# Patient Record
Sex: Male | Born: 1965 | Race: White | Hispanic: No | Marital: Single | Smoking: Current every day smoker
Health system: Southern US, Community
[De-identification: ages and names within clinical notes are randomized; demographics above are authoritative.]

## PROBLEM LIST (undated history)

## (undated) DIAGNOSIS — F909 Attention-deficit hyperactivity disorder, unspecified type: Secondary | ICD-10-CM

---

## 2001-11-28 ENCOUNTER — Emergency Department (HOSPITAL_COMMUNITY): Admission: EM | Admit: 2001-11-28 | Discharge: 2001-11-28 | Payer: Self-pay | Admitting: Emergency Medicine

## 2003-07-29 ENCOUNTER — Emergency Department (HOSPITAL_COMMUNITY): Admission: EM | Admit: 2003-07-29 | Discharge: 2003-07-29 | Payer: Self-pay | Admitting: Emergency Medicine

## 2004-03-14 ENCOUNTER — Emergency Department: Payer: Self-pay | Admitting: Emergency Medicine

## 2005-05-28 ENCOUNTER — Emergency Department: Payer: Self-pay | Admitting: Emergency Medicine

## 2006-01-15 ENCOUNTER — Emergency Department: Payer: Self-pay | Admitting: Emergency Medicine

## 2006-02-17 ENCOUNTER — Emergency Department: Payer: Self-pay | Admitting: Unknown Physician Specialty

## 2006-05-22 ENCOUNTER — Emergency Department: Payer: Self-pay | Admitting: Emergency Medicine

## 2006-07-19 ENCOUNTER — Emergency Department (HOSPITAL_COMMUNITY): Admission: EM | Admit: 2006-07-19 | Discharge: 2006-07-19 | Payer: Self-pay | Admitting: Emergency Medicine

## 2006-08-29 ENCOUNTER — Emergency Department: Payer: Self-pay | Admitting: Emergency Medicine

## 2006-09-08 ENCOUNTER — Other Ambulatory Visit: Payer: Self-pay

## 2006-09-08 ENCOUNTER — Observation Stay: Payer: Self-pay | Admitting: Internal Medicine

## 2006-09-10 ENCOUNTER — Emergency Department: Payer: Self-pay | Admitting: Internal Medicine

## 2006-11-19 ENCOUNTER — Emergency Department: Payer: Self-pay | Admitting: Unknown Physician Specialty

## 2006-11-28 ENCOUNTER — Emergency Department: Payer: Self-pay | Admitting: Emergency Medicine

## 2006-12-18 ENCOUNTER — Emergency Department (HOSPITAL_COMMUNITY): Admission: EM | Admit: 2006-12-18 | Discharge: 2006-12-18 | Payer: Self-pay | Admitting: Emergency Medicine

## 2007-04-03 ENCOUNTER — Emergency Department: Payer: Self-pay | Admitting: Internal Medicine

## 2007-05-15 ENCOUNTER — Emergency Department: Payer: Self-pay | Admitting: Emergency Medicine

## 2007-08-13 ENCOUNTER — Emergency Department: Payer: Self-pay | Admitting: Emergency Medicine

## 2007-10-01 ENCOUNTER — Emergency Department: Payer: Self-pay | Admitting: Emergency Medicine

## 2009-10-17 IMAGING — CT CT HEAD WITHOUT CONTRAST
2 series · 16 of 30 positions shown, 20 images · non-contrast
Comparison: none

REASON FOR EXAM: Headache
COMMENTS:

[Series 2: without · axial · non-contrast · 0.46mm/px · z∈[+1230,+1360]mm · 13 of 32 slices shown, 17 images]
[im 3/32  brain]
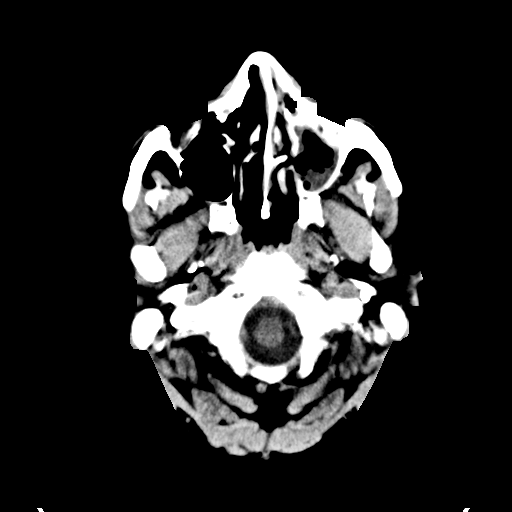
[im 3/32  bone]
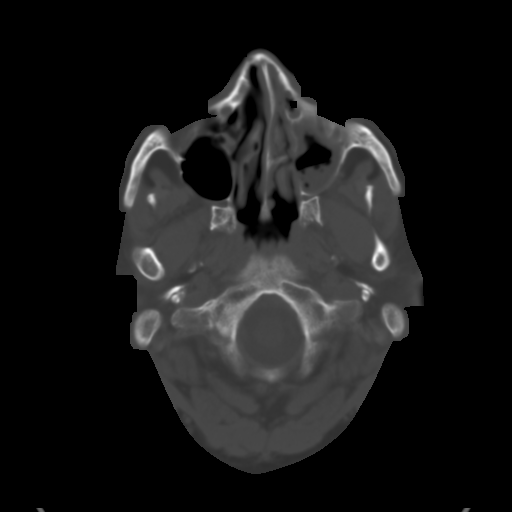
[im 5/32  brain]
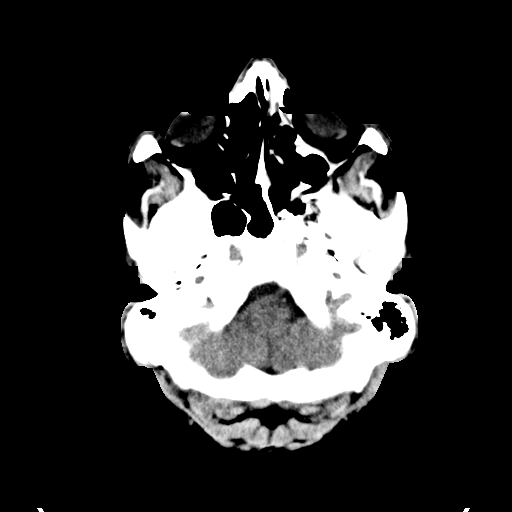
[im 7/32  brain]
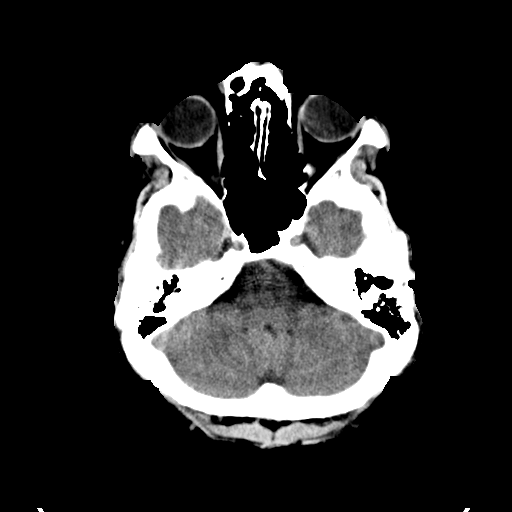
[im 9/32  brain]
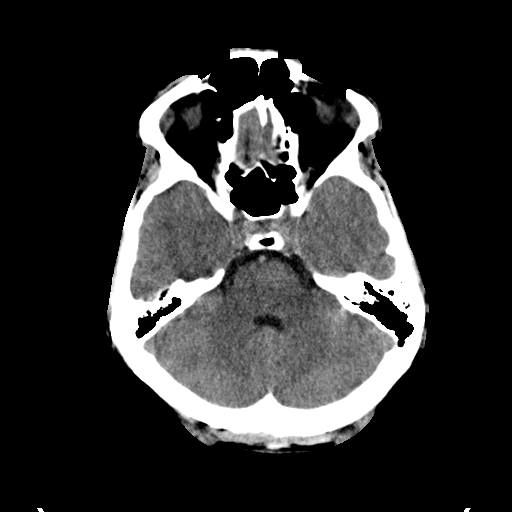
[im 12/32  brain]
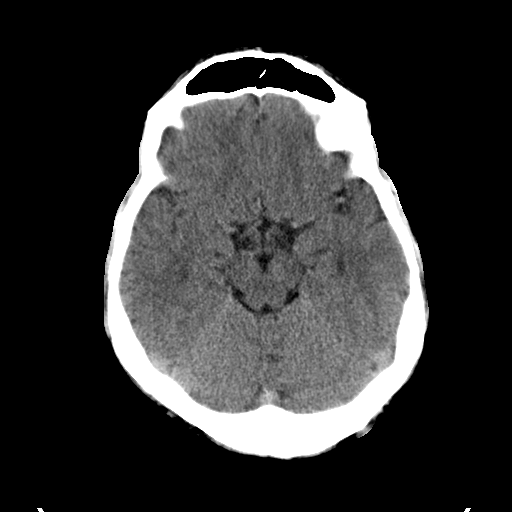
[im 12/32  bone]
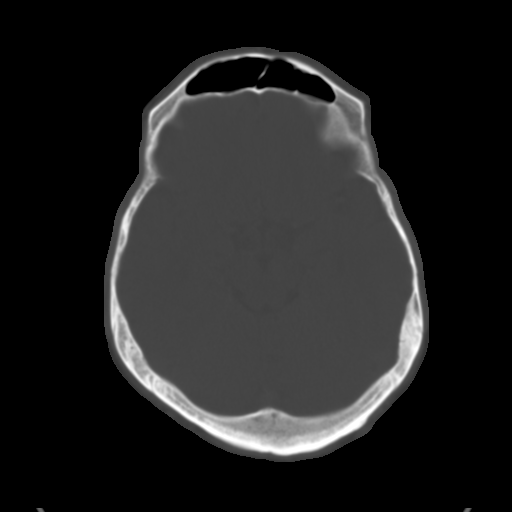
[im 14/32  brain]
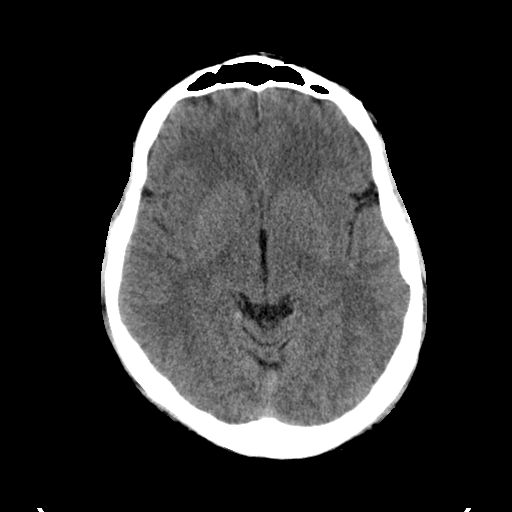
[im 16/32  brain]
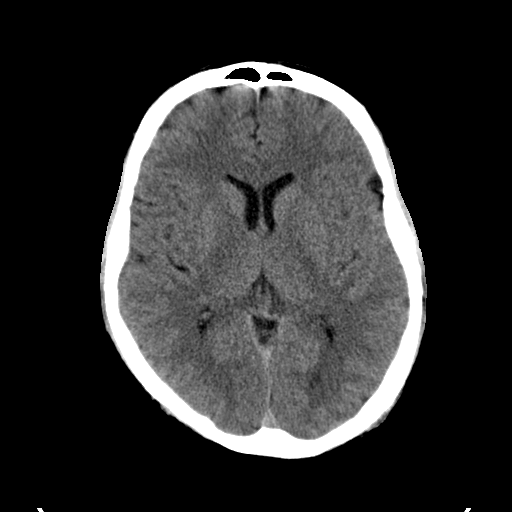
[im 18/32  brain]
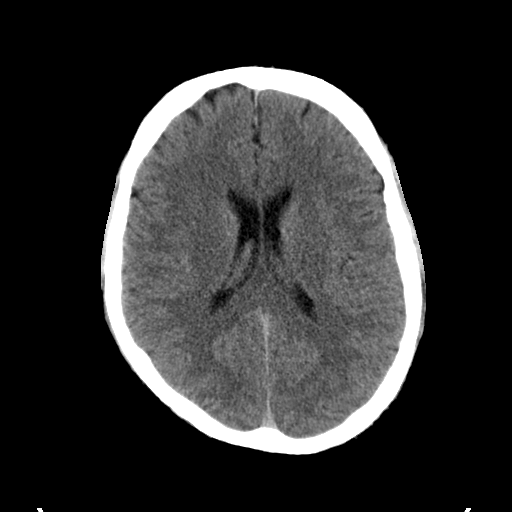
[im 20/32  brain]
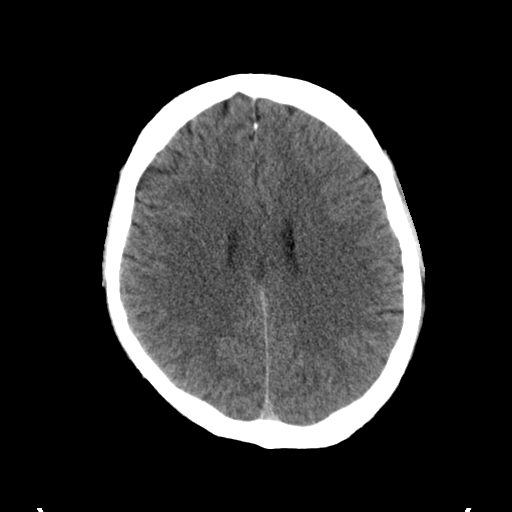
[im 20/32  bone]
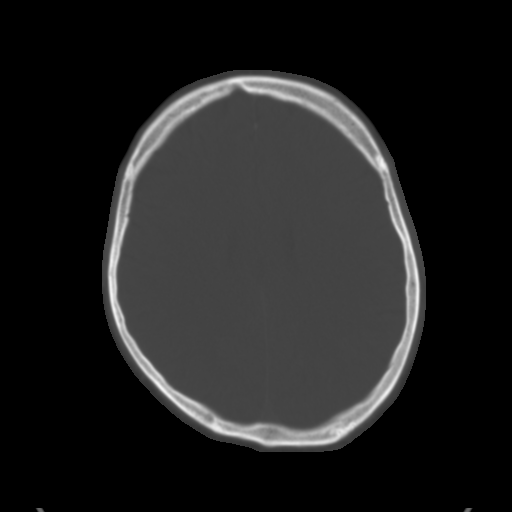
[im 23/32  brain]
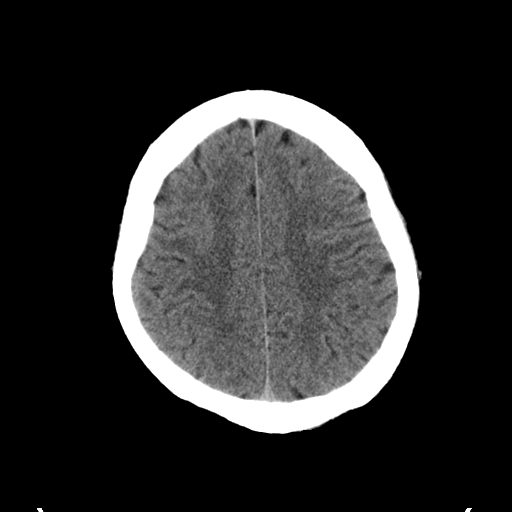
[im 25/32  brain]
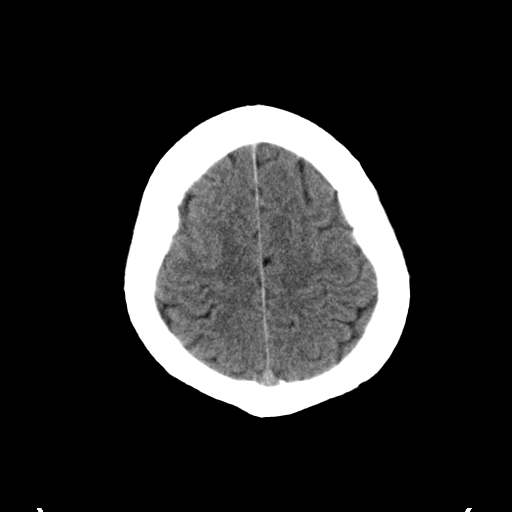
[im 27/32  brain]
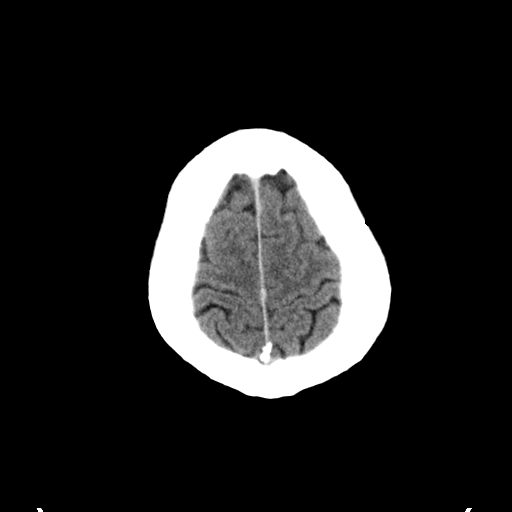
[im 29/32  brain]
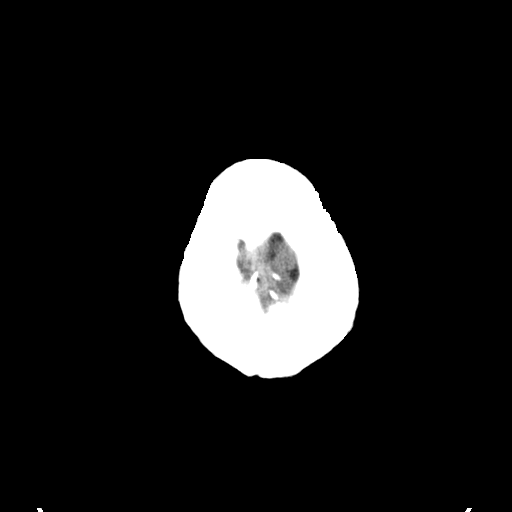
[im 29/32  bone]
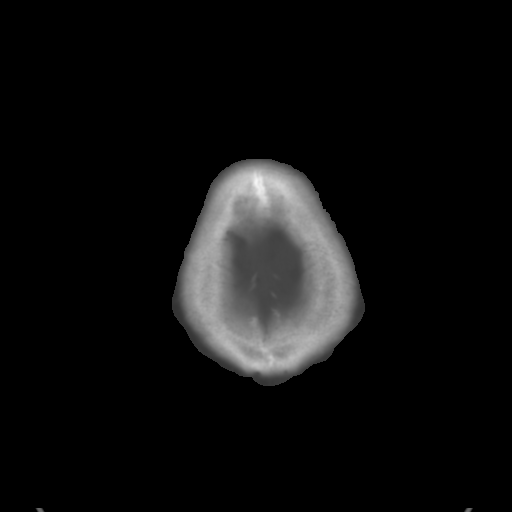

[Series 3: bone · axial · 0.46mm/px · z∈[+1230,+1275]mm · 3 of 32 slices shown]
[im 3/32  bone]
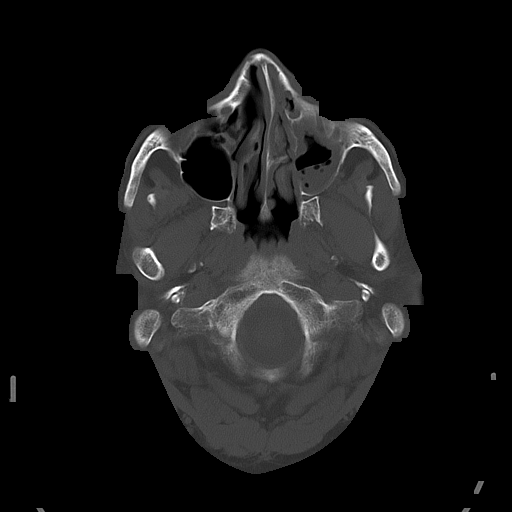
[im 7/32  bone]
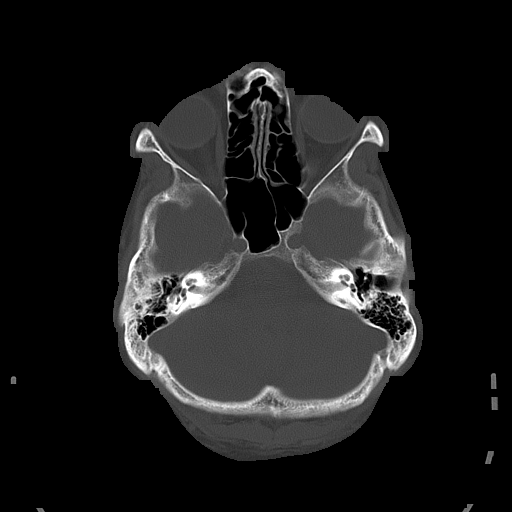
[im 12/32  bone]
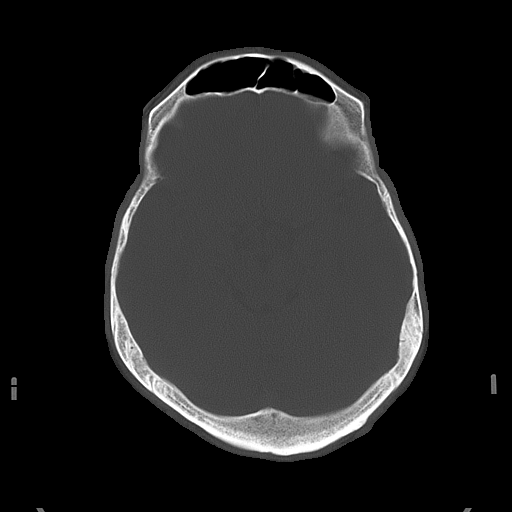

[16 of 30 positions shown; findings below may reference images not displayed]

PROCEDURE:     CT  - CT HEAD WITHOUT CONTRAST  - April 03, 2007  [DATE]

RESULT:     There is no evidence of intra-axial or extra-axial fluid
collections or evidence of acute hemorrhage. No secondary signs are
appreciated to suggest mass effect or subacute or chronic infarction. The
visualized bony skeleton demonstrates no evidence of fracture or
dislocation. There is near complete opacification of the LEFT maxillary
sinus.
IMPRESSION: 1.     No evidence of acute intracranial abnormalities.
2.     Findings appear to reflect the sequelae of sinus disease within the
LEFT maxillary sinus.
3.     Dr. Odd-Martin of the Emergency Department was informed of these findings
via preliminary faxed report on 04/03/2007 at [DATE], Central Standard Time.

## 2010-07-29 ENCOUNTER — Emergency Department (HOSPITAL_COMMUNITY)
Admission: EM | Admit: 2010-07-29 | Discharge: 2010-07-29 | Disposition: A | Discharge status: Home / Self Care | Payer: Self-pay | Attending: Emergency Medicine | Admitting: Emergency Medicine

## 2010-07-29 ENCOUNTER — Emergency Department (HOSPITAL_COMMUNITY): Payer: Self-pay

## 2010-07-29 DIAGNOSIS — R1110 Vomiting, unspecified: Secondary | ICD-10-CM | POA: Insufficient documentation

## 2010-07-29 DIAGNOSIS — M545 Low back pain, unspecified: Secondary | ICD-10-CM | POA: Insufficient documentation

## 2010-07-29 DIAGNOSIS — R109 Unspecified abdominal pain: Secondary | ICD-10-CM | POA: Insufficient documentation

## 2010-07-29 LAB — URINALYSIS, ROUTINE W REFLEX MICROSCOPIC
Glucose, UA: NEGATIVE mg/dL
Ketones, ur: NEGATIVE mg/dL
Specific Gravity, Urine: 1.028 (ref 1.005–1.030)
Urobilinogen, UA: 1 mg/dL (ref 0.0–1.0)

## 2010-07-29 LAB — BASIC METABOLIC PANEL
BUN: 16 mg/dL (ref 6–23)
Calcium: 9 mg/dL (ref 8.4–10.5)
Creatinine, Ser: 0.99 mg/dL (ref 0.50–1.35)
GFR calc non Af Amer: >60 mL/min (ref >60)
Glucose, Bld: 69 mg/dL — ABNORMAL LOW (ref 70–99)
Potassium: 3.8 mEq/L (ref 3.5–5.1)

## 2010-07-29 LAB — CBC
MCHC: 34.5 g/dL (ref 30.0–36.0)
Platelets: 145 10*3/uL — ABNORMAL LOW (ref 150–400)
RBC: 4.78 MIL/uL (ref 4.22–5.81)
WBC: 9 10*3/uL (ref 4.0–10.5)

## 2010-07-29 LAB — DIFFERENTIAL
Basophils Absolute: 0 10*3/uL (ref 0.0–0.1)
Eosinophils Absolute: 0.3 10*3/uL (ref 0.0–0.7)
Lymphocytes Relative: 45 % (ref 12–46)
Monocytes Absolute: 0.5 10*3/uL (ref 0.1–1.0)
Neutro Abs: 4.2 10*3/uL (ref 1.7–7.7)
Neutrophils Relative %: 47 % (ref 43–77)

## 2010-07-29 LAB — URINE MICROSCOPIC-ADD ON

## 2010-11-12 LAB — URINALYSIS, ROUTINE W REFLEX MICROSCOPIC
Bilirubin Urine: NEGATIVE
Glucose, UA: NEGATIVE
Ketones, ur: NEGATIVE
Nitrite: NEGATIVE
Protein, ur: NEGATIVE
Specific Gravity, Urine: 1.026
Urobilinogen, UA: 1
pH: 5.5

## 2010-11-12 LAB — DIFFERENTIAL
Eosinophils Absolute: 0.6
Eosinophils Relative: 7 — ABNORMAL HIGH
Monocytes Relative: 5

## 2010-11-12 LAB — BASIC METABOLIC PANEL
BUN: 9
CO2: 28
Chloride: 102
GFR calc non Af Amer: >60
Potassium: 3.9

## 2010-11-12 LAB — I-STAT 8, (EC8 V) (CONVERTED LAB)
Acid-Base Excess: 4 — ABNORMAL HIGH
BUN: 10
Bicarbonate: 28.7 — ABNORMAL HIGH
Chloride: 105
Glucose, Bld: 99
HCT: 43
Hemoglobin: 14.6
Operator id: 270651
Potassium: 4.1
Sodium: 137
TCO2: 30
pCO2, Ven: 44.7 — ABNORMAL LOW
pH, Ven: 7.417 — ABNORMAL HIGH

## 2010-11-12 LAB — CBC
MCHC: 34.3
Platelets: 248
WBC: 8.8

## 2010-11-12 LAB — POCT I-STAT CREATININE
Creatinine, Ser: 1.1
Operator id: 270651

## 2012-12-08 ENCOUNTER — Emergency Department: Payer: Self-pay | Admitting: Emergency Medicine

## 2012-12-08 LAB — DRUG SCREEN, URINE
Amphetamines, Ur Screen: NEGATIVE (ref ?–1000)
Benzodiazepine, Ur Scrn: POSITIVE (ref ?–200)
MDMA (Ecstasy)Ur Screen: NEGATIVE (ref ?–500)
Methadone, Ur Screen: POSITIVE (ref ?–300)
Opiate, Ur Screen: NEGATIVE (ref ?–300)
Tricyclic, Ur Screen: POSITIVE (ref ?–1000)

## 2012-12-08 LAB — COMPREHENSIVE METABOLIC PANEL
Albumin: 3.8 g/dL (ref 3.4–5.0)
Alkaline Phosphatase: 80 U/L (ref 50–136)
Chloride: 104 mmol/L (ref 98–107)
EGFR (Non-African Amer.): >60
Osmolality: 272 (ref 275–301)
Potassium: 4.2 mmol/L (ref 3.5–5.1)
SGOT(AST): 28 U/L (ref 15–37)
SGPT (ALT): 31 U/L (ref 12–78)

## 2012-12-08 LAB — CBC
HGB: 14.4 g/dL (ref 13.0–18.0)
MCH: 29.4 pg (ref 26.0–34.0)
MCHC: 35.1 g/dL (ref 32.0–36.0)
MCV: 84 fL (ref 80–100)
Platelet: 155 10*3/uL (ref 150–440)
RBC: 4.91 10*6/uL (ref 4.40–5.90)
WBC: 7.7 10*3/uL (ref 3.8–10.6)

## 2012-12-08 LAB — URINALYSIS, COMPLETE
Bilirubin,UR: NEGATIVE
Ph: 5 (ref 4.5–8.0)
Specific Gravity: 1.006 (ref 1.003–1.030)
WBC UR: 1 /HPF (ref 0–5)

## 2012-12-08 LAB — SALICYLATE LEVEL: Salicylates, Serum: 2.3 mg/dL

## 2012-12-08 LAB — ETHANOL: Ethanol %: <0.003 % (ref 0.000–0.080)

## 2015-09-19 ENCOUNTER — Emergency Department: Payer: Self-pay

## 2015-09-19 ENCOUNTER — Emergency Department
Admission: EM | Admit: 2015-09-19 | Discharge: 2015-09-20 | Disposition: A | Discharge status: Other Acute Inpatient Hospital | Payer: Self-pay | Attending: Emergency Medicine | Admitting: Emergency Medicine

## 2015-09-19 DIAGNOSIS — T1491XA Suicide attempt, initial encounter: Secondary | ICD-10-CM

## 2015-09-19 DIAGNOSIS — X781XXA Intentional self-harm by knife, initial encounter: Secondary | ICD-10-CM | POA: Insufficient documentation

## 2015-09-19 DIAGNOSIS — F909 Attention-deficit hyperactivity disorder, unspecified type: Secondary | ICD-10-CM | POA: Insufficient documentation

## 2015-09-19 DIAGNOSIS — S31109A Unspecified open wound of abdominal wall, unspecified quadrant without penetration into peritoneal cavity, initial encounter: Secondary | ICD-10-CM

## 2015-09-19 DIAGNOSIS — F17210 Nicotine dependence, cigarettes, uncomplicated: Secondary | ICD-10-CM | POA: Insufficient documentation

## 2015-09-19 DIAGNOSIS — S31119A Laceration without foreign body of abdominal wall, unspecified quadrant without penetration into peritoneal cavity, initial encounter: Secondary | ICD-10-CM | POA: Insufficient documentation

## 2015-09-19 DIAGNOSIS — T1491 Suicide attempt: Secondary | ICD-10-CM | POA: Insufficient documentation

## 2015-09-19 DIAGNOSIS — Y999 Unspecified external cause status: Secondary | ICD-10-CM | POA: Insufficient documentation

## 2015-09-19 DIAGNOSIS — Y9289 Other specified places as the place of occurrence of the external cause: Secondary | ICD-10-CM | POA: Insufficient documentation

## 2015-09-19 DIAGNOSIS — Y9389 Activity, other specified: Secondary | ICD-10-CM | POA: Insufficient documentation

## 2015-09-19 HISTORY — DX: Attention-deficit hyperactivity disorder, unspecified type: F90.9

## 2015-09-19 LAB — COMPREHENSIVE METABOLIC PANEL
ALBUMIN: 4.1 g/dL (ref 3.5–5.0)
ALT: 25 U/L (ref 17–63)
AST: 30 U/L (ref 15–41)
Alkaline Phosphatase: 77 U/L (ref 38–126)
Anion gap: 6 (ref 5–15)
BUN: 12 mg/dL (ref 6–20)
CO2: 26 mmol/L (ref 22–32)
CREATININE: 1.04 mg/dL (ref 0.61–1.24)
Calcium: 9 mg/dL (ref 8.9–10.3)
Chloride: 107 mmol/L (ref 101–111)
GFR calc Af Amer: >60 mL/min (ref >60)
GFR calc non Af Amer: >60 mL/min (ref >60)
GLUCOSE: 84 mg/dL (ref 65–99)
POTASSIUM: 4 mmol/L (ref 3.5–5.1)
Sodium: 139 mmol/L (ref 135–145)
TOTAL PROTEIN: 7.1 g/dL (ref 6.5–8.1)

## 2015-09-19 LAB — CBC WITH DIFFERENTIAL/PLATELET
BASOS ABS: 0 10*3/uL (ref 0–0.1)
Basophils Relative: 0 %
EOS PCT: 3 %
Eosinophils Absolute: 0.2 10*3/uL (ref 0–0.7)
HEMATOCRIT: 42.6 % (ref 40.0–52.0)
HEMOGLOBIN: 14.5 g/dL (ref 13.0–18.0)
LYMPHS ABS: 2.3 10*3/uL (ref 1.0–3.6)
LYMPHS PCT: 38 %
MCH: 28.6 pg (ref 26.0–34.0)
MCHC: 34.1 g/dL (ref 32.0–36.0)
MCV: 83.8 fL (ref 80.0–100.0)
Monocytes Absolute: 0.3 10*3/uL (ref 0.2–1.0)
Monocytes Relative: 5 %
NEUTROS ABS: 3.3 10*3/uL (ref 1.4–6.5)
NEUTROS PCT: 54 %
PLATELETS: 155 10*3/uL (ref 150–440)
RBC: 5.08 MIL/uL (ref 4.40–5.90)
RDW: 14.3 % (ref 11.5–14.5)
WBC: 6.1 10*3/uL (ref 3.8–10.6)

## 2015-09-19 LAB — ETHANOL

## 2015-09-19 LAB — ACETAMINOPHEN LEVEL: Acetaminophen (Tylenol), Serum: <10 ug/mL — ABNORMAL LOW (ref 10–30)

## 2015-09-19 LAB — SALICYLATE LEVEL: Salicylate Lvl: <4 mg/dL (ref 2.8–30.0)

## 2015-09-19 MED ORDER — LORAZEPAM 2 MG/ML IJ SOLN
INTRAMUSCULAR | Status: AC
Start: 2015-09-19 — End: 2015-09-19
  Administered 2015-09-19: 2 mg via INTRAVENOUS
  Filled 2015-09-19: qty 1

## 2015-09-19 MED ORDER — DIPHENHYDRAMINE HCL 50 MG/ML IJ SOLN
INTRAMUSCULAR | Status: AC
  Filled 2015-09-19: qty 1

## 2015-09-19 MED ORDER — LORAZEPAM 2 MG/ML IJ SOLN: 2.0000 mg | Freq: Once | INTRAMUSCULAR | Status: DC

## 2015-09-19 MED ORDER — HALOPERIDOL LACTATE 5 MG/ML IJ SOLN
2.5000 mg | Freq: Once | INTRAMUSCULAR | Status: AC
  Administered 2015-09-19: 2.5 mg via INTRAMUSCULAR

## 2015-09-19 MED ORDER — HALOPERIDOL LACTATE 5 MG/ML IJ SOLN
INTRAMUSCULAR | Status: AC
Start: 2015-09-19 — End: 2015-09-19
  Administered 2015-09-19: 2.5 mg via INTRAMUSCULAR
  Filled 2015-09-19: qty 1

## 2015-09-19 MED ORDER — LORAZEPAM 2 MG/ML IJ SOLN
2.0000 mg | Freq: Once | INTRAMUSCULAR | Status: AC
  Administered 2015-09-19 (×2): 2 mg via INTRAVENOUS

## 2015-09-19 MED ORDER — IOPAMIDOL (ISOVUE-300) INJECTION 61%
100.0000 mL | Freq: Once | INTRAVENOUS | Status: AC | PRN
  Administered 2015-09-19: 100 mL via INTRAVENOUS

## 2015-09-19 MED ORDER — HALOPERIDOL LACTATE 5 MG/ML IJ SOLN
INTRAMUSCULAR | Status: AC
  Filled 2015-09-19: qty 1

## 2015-09-19 MED ORDER — LORAZEPAM 2 MG/ML IJ SOLN
INTRAMUSCULAR | Status: AC
  Administered 2015-09-19: 2 mg via INTRAVENOUS
  Filled 2015-09-19: qty 1

## 2015-09-19 NOTE — [facility] Counselor (Signed)
TTS Counselor made attempt to complete assessment; pt continues to be too drowsy participate in interview.

## 2015-09-19 NOTE — ED Notes (Signed)
Pt returned from CT °

## 2015-09-19 NOTE — ED Notes (Signed)
Pt given 2 mg ativan per MD Malinda verbal order

## 2015-09-19 NOTE — ED Notes (Signed)
Pt withering around bed c/o stomach pain

## 2015-09-19 NOTE — ED Notes (Signed)
Security at bedside
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
## 2015-09-19 NOTE — ED Notes (Signed)
Pt transported to CT via stretcher with ODS [place]o.

## 2015-09-19 NOTE — ED Notes (Signed)
Lab called about pts lab results, stated that they were being run

## 2015-09-19 NOTE — ED Notes (Signed)
2.5 mg haldol and 2 mg ativan given per MD Malinda verbal order

## 2015-09-19 NOTE — ED Notes (Addendum)
Lab called about pts CMP results
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
## 2015-09-19 NOTE — ED Notes (Signed)
Pt refusing blood draw at this time. MD Darnelle CatalanMalinda notified

## 2015-09-19 NOTE — ED Notes (Signed)
Spoke w/ lab about pts results, they stated that pts lab had to be rerun under different name
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
## 2015-09-19 NOTE — ED Notes (Signed)
Spoke w/ pts sister, she stated that pts does have drug problem and requested that he be psychiatrically evaluated   Ludwig ClarksAngela Jefferies [phone]

## 2015-09-19 NOTE — ED Notes (Signed)
Patient transported to CT 

## 2015-09-19 NOTE — ED Notes (Signed)
Pt has agreed to cooperate at this time.

## 2015-09-20 ENCOUNTER — Inpatient Hospital Stay
Admission: RE | Admit: 2015-09-20 | Discharge: 2015-09-25 | DRG: 882 | Disposition: A | Discharge status: Home / Self Care | Payer: No Typology Code available for payment source | Source: Intra-hospital | Attending: Psychiatry | Admitting: Psychiatry

## 2015-09-20 DIAGNOSIS — F17210 Nicotine dependence, cigarettes, uncomplicated: Secondary | ICD-10-CM | POA: Diagnosis present

## 2015-09-20 DIAGNOSIS — F322 Major depressive disorder, single episode, severe without psychotic features: Secondary | ICD-10-CM

## 2015-09-20 DIAGNOSIS — S31119A Laceration without foreign body of abdominal wall, unspecified quadrant without penetration into peritoneal cavity, initial encounter: Secondary | ICD-10-CM | POA: Diagnosis present

## 2015-09-20 DIAGNOSIS — F17200 Nicotine dependence, unspecified, uncomplicated: Secondary | ICD-10-CM | POA: Diagnosis present

## 2015-09-20 DIAGNOSIS — Y92414 Local residential or business street as the place of occurrence of the external cause: Secondary | ICD-10-CM

## 2015-09-20 DIAGNOSIS — Y281XXA Contact with knife, undetermined intent, initial encounter: Secondary | ICD-10-CM | POA: Diagnosis present

## 2015-09-20 DIAGNOSIS — F1120 Opioid dependence, uncomplicated: Secondary | ICD-10-CM

## 2015-09-20 DIAGNOSIS — Z811 Family history of alcohol abuse and dependence: Secondary | ICD-10-CM | POA: Diagnosis not present

## 2015-09-20 DIAGNOSIS — F4324 Adjustment disorder with disturbance of conduct: Secondary | ICD-10-CM

## 2015-09-20 DIAGNOSIS — F1021 Alcohol dependence, in remission: Secondary | ICD-10-CM

## 2015-09-20 MED ORDER — HYDROXYZINE HCL 25 MG PO TABS
25.0000 mg | ORAL_TABLET | Freq: Three times a day (TID) | ORAL | Status: DC | PRN
  Administered 2015-09-21 – 2015-09-25 (×6): 25 mg via ORAL
  Filled 2015-09-20 (×5): qty 1

## 2015-09-20 MED ORDER — TRAZODONE HCL 100 MG PO TABS
100.0000 mg | ORAL_TABLET | Freq: Every evening | ORAL | Status: DC | PRN
  Administered 2015-09-21 – 2015-09-24 (×4): 100 mg via ORAL
  Filled 2015-09-20 (×4): qty 1

## 2015-09-20 MED ORDER — LIDOCAINE-EPINEPHRINE 1 %-1:100000 IJ SOLN
30.0000 mL | Freq: Once | INTRAMUSCULAR | Status: AC
Start: 2015-09-20 — End: 2015-09-20
  Administered 2015-09-20: 30 mL via INTRADERMAL

## 2015-09-20 MED ORDER — ACETAMINOPHEN 325 MG PO TABS
650.0000 mg | ORAL_TABLET | Freq: Four times a day (QID) | ORAL | Status: DC | PRN
  Administered 2015-09-21: 650 mg via ORAL
  Filled 2015-09-20: qty 2

## 2015-09-20 MED ORDER — ALUM & MAG HYDROXIDE-SIMETH 200-200-20 MG/5ML PO SUSP: 30.0000 mL | ORAL | Status: DC | PRN

## 2015-09-20 MED ORDER — LIDOCAINE-EPINEPHRINE (PF) 1 %-1:200000 IJ SOLN
INTRAMUSCULAR | Status: AC
  Filled 2015-09-20: qty 30

## 2015-09-20 MED ORDER — MAGNESIUM HYDROXIDE 400 MG/5ML PO SUSP: 30.0000 mL | Freq: Every day | ORAL | Status: DC | PRN
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
## 2015-09-20 NOTE — ED Notes (Signed)
Patient noted in room. No complaints, stable, in no acute distress. Q15 minute rounds and monitoring via Security Cameras to continue.  

## 2015-09-20 NOTE — ED Notes (Signed)
BEHAVIORAL HEALTH ROUNDING Patient sleeping: Yes.   Patient alert and oriented: not applicable SLEEPING Behavior appropriate: Yes.  ; If no, describe: SLEEPING Nutrition and fluids offered: No SLEEPING Toileting and hygiene offered: NoSLEEPING Sitter present: not applicable, Q 15 min safety rounds and observation. Law enforcement present: Yes ODS 
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
## 2015-09-20 NOTE — ED Notes (Signed)
Patient noted in sleeping room. No complaints, stable, in no acute distress. Q15 minute rounds and monitoring via Security Cameras to continue. 

## 2015-09-20 NOTE — ED Notes (Signed)
Dr. Darnelle CatalanMalinda at bedside with staple gun

## 2015-09-20 NOTE — ED Notes (Addendum)
Pt slept for most of a.m. He is now speaking with Rozell SearingWendy RN.

## 2015-09-20 NOTE — ED Notes (Signed)
Pt is alert on admission, but is also irritable and somewhat hostile. Writer [facility] and offered fluids but pt refused water. Pt would not answer questions about SI/HI and AVH. 15 minute checks are ongoing for safety. Pt went to sleep soon after admission.

## 2015-09-20 NOTE — ED Notes (Signed)

## 2015-09-20 NOTE — ED Notes (Signed)
Patient noted in day room. No complaints, stable, in no acute distress. Q15 minute rounds and monitoring via Security Cameras to continue. 

## 2015-09-20 NOTE — ED Notes (Signed)
Report called to Eric, RN in ED [facility].  

## 2015-09-20 NOTE — ED Notes (Signed)
BEHAVIORAL HEALTH ROUNDING  Patient sleeping: No.  Patient alert and oriented: yes  Behavior appropriate: Yes. ; If no, describe:  Nutrition and fluids offered: Yes  Toileting and hygiene offered: Yes  Sitter present: not applicable, Q 15 min safety rounds and observation.  Law enforcement present: Yes ODS  

## 2015-09-20 NOTE — ED Notes (Signed)
Report to include Situation, Background, Assessment, and Recommendations received from Melissa NoonKaren Shugart RN. Patient alert and oriented, warm and dry, in no acute distress. Patient denies SI, HI, AVH and pain. Patient made aware of Q15 minute rounds and security cameras for their safety. Patient instructed to come to me with needs or concerns.
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
## 2015-09-20 NOTE — ED Notes (Signed)
Snack and beverage given. 

## 2015-09-20 NOTE — Progress Notes (Addendum)
[facility]. Toni Amendlapacs.  Attending Physician will be Dr. Jennet MaduroPucilowska.   [facility] Marylu LundJanet.   Intake Paper Work has been signed and placed on patient chart.  ER staff is aware of the admission Irving Burton( [facility].; ER MD; Amy Patient's Nurse & Lowanda FosterBrittany  Patient Access).    09/20/2015 Cheryl FlashNicole Hoke Baer, MS, NCC, LPCA Therapeutic Triage Specialist

## 2015-09-20 NOTE — Progress Notes (Signed)
Pt being reviewed for possible admission to [facility]. H&P and Assessment have been faxed to the B[facility] for the charge nurse to review and provide bed assignment.      Nicole Ebbie Cherry, MS, NCC, LPCA Therapeutic Triage Specialist    

## 2015-09-21 ENCOUNTER — Encounter: Payer: Self-pay | Admitting: Psychiatry

## 2015-09-21 DIAGNOSIS — F17200 Nicotine dependence, unspecified, uncomplicated: Secondary | ICD-10-CM | POA: Diagnosis present

## 2015-09-21 DIAGNOSIS — F4324 Adjustment disorder with disturbance of conduct: Principal | ICD-10-CM

## 2015-09-21 DIAGNOSIS — F1021 Alcohol dependence, in remission: Secondary | ICD-10-CM

## 2015-09-21 DIAGNOSIS — F1120 Opioid dependence, uncomplicated: Secondary | ICD-10-CM

## 2015-09-21 LAB — URINALYSIS COMPLETE WITH MICROSCOPIC (ARMC ONLY)
BILIRUBIN URINE: NEGATIVE
Glucose, UA: 150 mg/dL — AB
HGB URINE DIPSTICK: NEGATIVE
KETONES UR: NEGATIVE mg/dL
Leukocytes, UA: NEGATIVE
Nitrite: NEGATIVE
PH: 7 (ref 5.0–8.0)
PROTEIN: NEGATIVE mg/dL
Specific Gravity, Urine: 1.013 (ref 1.005–1.030)

## 2015-09-21 LAB — URINE DRUG SCREEN, QUALITATIVE (ARMC ONLY)
Amphetamines, Ur Screen: NOT DETECTED
BARBITURATES, UR SCREEN: NOT DETECTED
BENZODIAZEPINE, UR SCRN: POSITIVE — AB
Cannabinoid 50 Ng, Ur ~~LOC~~: NOT DETECTED
Cocaine Metabolite,Ur ~~LOC~~: NOT DETECTED
MDMA (Ecstasy)Ur Screen: NOT DETECTED
METHADONE SCREEN, URINE: POSITIVE — AB
OPIATE, UR SCREEN: NOT DETECTED
PHENCYCLIDINE (PCP) UR S: NOT DETECTED
Tricyclic, Ur Screen: NOT DETECTED

## 2015-09-21 MED ORDER — IBUPROFEN 600 MG PO TABS
600.0000 mg | ORAL_TABLET | Freq: Four times a day (QID) | ORAL | Status: DC | PRN
  Administered 2015-09-21 – 2015-09-25 (×7): 600 mg via ORAL
  Filled 2015-09-21 (×7): qty 1

## 2015-09-21 MED ORDER — NICOTINE 21 MG/24HR TD PT24
21.0000 mg | MEDICATED_PATCH | Freq: Every day | TRANSDERMAL | Status: DC
  Administered 2015-09-22 – 2015-09-25 (×4): 21 mg via TRANSDERMAL
  Filled 2015-09-21 (×5): qty 1

## 2015-09-21 MED ORDER — ACETAMINOPHEN 500 MG PO TABS
1000.0000 mg | ORAL_TABLET | Freq: Four times a day (QID) | ORAL | Status: DC | PRN
  Administered 2015-09-22 – 2015-09-24 (×2): 1000 mg via ORAL
  Filled 2015-09-21 (×2): qty 2

## 2015-09-21 MED ORDER — TRAMADOL HCL 50 MG PO TABS
50.0000 mg | ORAL_TABLET | Freq: Two times a day (BID) | ORAL | Status: DC | PRN
  Administered 2015-09-21 – 2015-09-25 (×7): 50 mg via ORAL
  Filled 2015-09-21 (×7): qty 1

## 2015-09-21 NOTE — Progress Notes (Signed)
Recreation Therapy Notes  Date: 08.18.17 Time: 1:00 pm Location: Craft Room  Group Topic: Self-expression, Coping Skills  Goal Area(s) Addresses:  Patient will effectively use art as a means of self-expression. Patient will recognize positive benefit of self-expression. Patient will be able to identify one emotion experienced during group session. Patient will identify use of art as a coping skill.  Behavioral Response: Attentive  Intervention: Two Faces of Me  Activity: Patients were given a blank face worksheet and instructed to draw a line down the middle. On one side of the face, patients were instructed to draw or write how they felt when they were admitted to the hospital. On the other side of the face, patients were instructed to draw or write how they want to feel when they are d/c.  Education: LRT educated patients on healthy coping skills.  Education Outcome: In group clarification offered   Clinical Observations/Feedback: Patient arrived to group late. LRT explained activity. Patient wrote how he felt when he was admitted to the hospital and how he wanted to feel when he was d/c. Patient did not contribute to group discussion.  Jacquelynn CreeGreene,Ebelin Dillehay M, LRT/CTRS 09/21/2015 3:17 PM
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
## 2015-09-21 NOTE — Tx Team (Signed)
Interdisciplinary Treatment Plan Update (Adult)         Date: 09/21/2015   Time Reviewed: 10:30 AM   Progress in Treatment: Improving Attending groups: Yes  Participating in groups: Yes  Taking medication as prescribed: Yes  Tolerating medication: Yes  Family/Significant other contact made: CSW assessing proper contacts Patient understands diagnosis: Yes  Discussing patient identified problems/goals with staff: Yes  Medical problems stabilized or resolved: Yes  Denies suicidal/homicidal ideation: Yes  Issues/concerns per patient self-inventory: Yes  Other:   New problem(s) identified: N/A   Discharge Plan or Barriers: see below   Reason for Continuation of Hospitalization:   Depression   Anxiety   Medication Stabilization   Comments: N/A   Estimated length of stay: 3-5 days    Patient is a 50 year old male admitted for suicidal attempt and depression . Patient lives in [place], [place]. Patient will benefit from crisis stabilization, medication evaluation, group therapy, and psycho education in addition to case management for discharge planning. Patient and CSW reviewed pt's identified goals and treatment plan. Pt verbalized understanding and agreed to treatment plan.    Review of initial/current patient goals per problem list:  1. Goal(s): Patient will participate in aftercare plan   Met: Goal progressing  Target date: 3-5 days post admission date   As evidenced by: Patient will participate within aftercare plan AEB aftercare provider and housing plan at discharge being identified.   CSW assessing proper aftercare plans.   2. Goal (s): Patient will exhibit decreased depressive symptoms and suicidal ideations.   Met: Goal progressing   Target date: 3-5 days post admission date   As evidenced by: Patient will utilize self-rating of depression at 3 or below and demonstrate decreased signs of depression or be deemed stable for discharge by MD.   Pt reports a  depression score of 5 at this time.  3. Goal(s): Patient will demonstrate decreased signs and symptoms of anxiety.   Met: No  Target date: 3-5 days post admission date   As evidenced by: Patient will utilize self-rating of anxiety at 3 or below and demonstrated decreased signs of anxiety, or be deemed stable for discharge by MD   Pt reports an anxiety score of 6.      Attendees:  Patient: Jeffery Leonard Family:  Physician: Merlyn Albert, MD    09/21/2015 10:30AM  Nursing: Elige Radon, RN     09/21/2015 10:30AM  Clinical Social Worker: Glorious Peach, [place]  09/21/2015 10:30AM  Other: Recreational Therapist, Everitt Amber  09/21/2015 10:30AM
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
## 2015-09-21 NOTE — [facility] Group Notes (Signed)
[facility] Group Notes:  (Nursing/MHT/Case Management/Adjunct)  Date:  09/21/2015  Time:  4:27 PM  Type of Therapy:  Psychoeducational Skills  Participation Level:  Did Not Attend  Jeffery Leonard 09/21/2015, 4:27 PM

## 2015-09-21 NOTE — Progress Notes (Signed)
Recreation Therapy Notes  INPATIENT RECREATION THERAPY ASSESSMENT  [name] MRN: [id number] DOB: 1965/08/23 Today's Date: 09/21/2015  Patient Stressors: Family, Relationship, Other (Comment) (Mother had a stroke recently; Girlfriend of 7-8 years - stressful relationship due to "my promiscuous ways"; finances)  Coping Skills:   Isolate, Exercise, Music, Sports, Other (Comment) (Watch TV)  Personal Challenges: Communication, Expressing Yourself, Relationships, Self-Esteem/Confidence, Time Management, Trusting Others  Leisure Interests (2+):  Individual - Other (Comment) (Work)  Awareness of Community Resources:  Yes  Community Resources:  Park  Current Use: No  If no, Barriers?: Other (Comment) (Doesn't feel like going)  Patient Strengths:  Hard worker  Patient Identified Areas of Improvement:  Bills - finances  Current Recreation Participation:  "It's kind of personal"  Patient Goal for Hospitalization:  Work on some of the problems  [place]  [place] Residence:  [place]   Current SI (including self-harm):  No  Current HI:  No  Consent to Intern Participation: N/A   Jacquelynn CreeGreene,Bridget Westbrooks M, LRT/CTRS 09/21/2015, 3:46 PM
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
## 2015-09-21 NOTE — [facility] Suicide Risk Assessment (Signed)
[facility] INPATIENT:  Family/Significant Other Suicide Prevention Education  Suicide Prevention Education:  Education Completed; sister,  Jeffery Leonard ph#: [phone]) [phone] has been identified by the patient as the family member/significant other with whom the patient will be residing, and identified as the person(s) who will aid the patient in the event of a mental health crisis (suicidal ideations/suicide attempt).  With written consent from the patient, the family member/significant other has been provided the following suicide prevention education, prior to the and/or following the discharge of the patient. Pt's sister stated that brother is a Financial tradermanipulator, he will deny everything but "he has problems that he must face." Pt stated that they were in foster care as children which may have had a huge impact on patient.  The suicide prevention education provided includes the following:  Suicide risk factors  Suicide prevention and interventions  National Suicide Hotline telephone number  [facility] assessment telephone number  [facility] Emergency Assistance 911  [facility] and/or Residential Mobile Crisis Unit telephone number  Request made of family/significant other to:  Remove weapons (e.g., guns, rifles, knives), all items previously/currently identified as safety concern.    Remove drugs/medications (over-the-counter, prescriptions, illicit drugs), all items previously/currently identified as a safety concern.  The family member/significant other verbalizes understanding of the suicide prevention education information provided.  The family member/significant other agrees to remove the items of safety concern listed above.  Jeffery Leonard, MSW, LCSW-A 09/21/2015, 12:21 PM
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
## 2015-09-21 NOTE — Plan of Care (Signed)
Problem: Safety: Goal: Ability to disclose and discuss suicidal ideas will improve Outcome: Progressing Patient denies SI.   

## 2015-09-21 NOTE — Tx Team (Signed)
Initial Interdisciplinary Treatment Plan   PATIENT STRESSORS: Marital or family conflict Substance abuse   PATIENT STRENGTHS: General fund of knowledge Supportive family/friends Work skills   PROBLEM LIST: Problem List/Patient Goals Date to be addressed Date deferred Reason deferred Estimated date of resolution  Self inflicted wound  09/21/15     "My mom being in the hospital is overwhelming." 09/21/15     Depression 09/21/15                                          DISCHARGE CRITERIA:  Improved stabilization in mood, thinking, and/or behavior  PRELIMINARY DISCHARGE PLAN: Outpatient therapy  PATIENT/FAMIILY INVOLVEMENT: This treatment plan has been presented to and reviewed with the patient, Amalia HaileyWayne Douglas Thomasson, and/or family member.  The patient and family have been given the opportunity to ask questions and make suggestions.  Lendell CapriceCasey N Matisse Salais 09/21/2015, 2:37 AM

## 2015-09-21 NOTE — [facility] Group Notes (Signed)
[facility] LCSW Group Therapy  09/21/2015 11:34 AM  Type of Therapy:  Group Therapy  Participation Level:  Pt did not attend group. CSW invited pt to group.    Summary of Progress/Problems: Feelings around Relapse. Group members discussed the meaning of relapse and shared personal stories of relapse, how it affected them and others, and how they perceived themselves during this time. Group members were encouraged to identify triggers, warning signs and coping skills used when facing the possibility of relapse. Social supports were discussed and explored in detail. Patients also discussed facing disappointment and how that can trigger someone to relapse.   Coda Mathey G. Garnette CzechSampson MSW, LCSWA 09/21/2015, 11:35 AM
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
## 2015-09-22 MED ORDER — ESCITALOPRAM OXALATE 10 MG PO TABS
10.0000 mg | ORAL_TABLET | Freq: Every day | ORAL | Status: DC
  Administered 2015-09-22 – 2015-09-25 (×4): 10 mg via ORAL
  Filled 2015-09-22 (×4): qty 1

## 2015-09-22 NOTE — [facility] Group Notes (Signed)
[facility] LCSW Group Therapy  09/22/2015 3:28 PM  Type of Therapy:  Group Therapy  Participation Level:  Pt did not attend group. CSW invited pt to group.   Summary of Progress/Problems:Self esteem: Patients discussed self esteem and how it impacts them. They discussed what aspects in their lives has influenced their self esteem. They were challenged to identify changes that are needed in order to improve self esteem. Patients participated in activity where they had to identify positive adjectives they felt described their personality. Patients shared with the group on the following areas: Things I am good at, What I like about my appearance, I've helped others by, What I value the most, compliments I have received, challenges I have overcome, thing that make me unique, and Times I've made others happy.    Kerri Asche G. Garnette CzechSampson MSW, LCSWA 09/22/2015, 3:28 PM

## 2015-09-22 NOTE — [facility] Group Notes (Signed)
[facility] Group Notes:  (Nursing/MHT/Case Management/Adjunct)  Date:  09/22/2015  Time:  5:18 AM  Type of Therapy:  Psychoeducational Skills  Participation Level:  Active  Participation Quality:  Appropriate  Affect:  Appropriate  Cognitive:  Appropriate  Insight:  Appropriate and Good  Engagement in Group:  Engaged  Modes of Intervention:  Discussion, Socialization and Support  Summary of Progress/Problems:  Jeffery MilroyLaquanda Y Katrina Leonard 09/22/2015, 5:18 AM

## 2015-09-22 NOTE — Progress Notes (Signed)
D: Patient appears flat. Stated he didn't have a great day. Denies SI/HI/AVH. RLA wound clean, dry, and intact. Patient has isolated to his room. C/o of trouble sleeping.  A: PRN trazodone given with education. Encouragement provided.  R: Patient was compliant with medication. He has remained calm and cooperative. Safety maintained with 15 min checks.
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
## 2015-09-23 NOTE — Progress Notes (Signed)
Pt denies SI/HI/AVH. Minimal interaction with staff. Attended evening group. Complains of pain in abdomen. Requested Ultram x 2 before it was able to be given. Given PRN Ibuprofen for pain. Effectiveness TBD. Encouragement and support provided. Medications given as prescribed. Voices no additional concern at this time. Will continue to monitor.
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
## 2015-09-23 NOTE — [facility] Group Notes (Signed)
[facility] Group Notes:  (Nursing/MHT/Case Management/Adjunct)  Date:  09/23/2015  Time:  4:57 AM  Type of Therapy:  Psychoeducational Skills  Participation Level:  Active  Participation Quality:  Appropriate  Affect:  Appropriate  Cognitive:  Appropriate  Insight:  Appropriate  Engagement in Group:  Engaged  Modes of Intervention:  Discussion, Socialization and Support  Summary of Progress/Problems:  Jeffery Leonard 09/23/2015, 4:57 AM

## 2015-09-23 NOTE — [facility] Group Notes (Signed)
[facility] LCSW Group Therapy  09/23/2015 2:51 PM  Type of Therapy:  Group Therapy  Participation Level:  Active  Participation Quality:  Attentive  Affect:  Appropriate  Cognitive:  Alert  Insight:  Improving  Engagement in Therapy:  Improving  Modes of Intervention:  Discussion, Reality Testing and Support  Summary of Progress/Problems:Coping Skills: Patients defined and discussed healthy coping skills. Patients identified healthy coping skills they would like to try during hospitalization and after discharge. CSW offered insight to varying coping skills that may have been new to patients such as practicing mindfulness. Pt stated he is willing to develop new coping skills before he is discharged.   Jeffery Leonard G. Jeffery Leonard MSW, LCSWA 09/23/2015, 2:54 PM

## 2015-09-24 NOTE — Plan of Care (Signed)
Problem: Coping: Goal: Ability to verbalize frustrations and anger appropriately will improve Outcome: Not Progressing Patient is not verbalizing feelings to staff.
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
## 2015-09-24 NOTE — [facility] Group Notes (Signed)
[facility] Group Notes:  (Nursing/MHT/Case Management/Adjunct)  Date:  09/24/2015  Time:  3:23 AM  Type of Therapy:  Group Therapy  Participation Level:  Did Not Attend   Jeffery Leonard 09/24/2015, 3:23 AM

## 2015-09-24 NOTE — Progress Notes (Signed)
Patient noted to be depressed, flat in affect and isolative. He did not attend groups this morning but did attend afternoon groups. He denied SI/HI/AVH and contracted for safety. Medication education done. Encouraged to verbalize any concerns to nursing staff. He verbalized understanding.

## 2015-09-24 NOTE — Plan of Care (Signed)
Problem: [facility] Participation in Recreation Therapeutic Interventions Goal: STG-Patient will demonstrate improved self esteem by identif STG: Self-Esteem - Within 4 treatment sessions, patient will verbalize at least 5 positive affirmation statements in of 2 treatment sessions to increase self-esteem post d/c.  Outcome: Progressing Treatment Session 1; Completed 1 out of 2: At approximately 11:50 am, LRT met with patient in patient room. Patient verbalized 5 positive affirmation statements. Patient reported it felt "pretty good". LRT encouraged patient to continue saying positive affirmation statements.  Leonette Monarch, LRT/CTRS 08.21.17 4:57 pm Goal: STG-Other Recreation Therapy Goal (Specify) STG: Time Management - Within 4 treatment sessions, patient will verbalize understanding of scheduling in each of 2 treatment sessions to increase time management skills post d/c.  Outcome: Progressing Treatment Session 1; Completed 1 out of 2: At approximately 11:50 am, LRT met with patient in patient room. LRT educated and provided patient with blank schedules. Patient verbalized understanding of scheduling. LRT encouraged patient to use schedules to help manage his time.  Leonette Monarch, LRT/CTRS 08.21.17 4:58 pm

## 2015-09-24 NOTE — Plan of Care (Signed)
Problem: Medication: Goal: Compliance with prescribed medication regimen will improve Outcome: Progressing Med compliant     

## 2015-09-24 NOTE — Progress Notes (Signed)
Recreation Therapy Notes  Date: 08.21.17 Time: 1:00 pm Location: Craft Room  Group Topic: Self-expression  Goal Area(s) Addresses:  Patient will identify at least one item per dimension of health. Patient will examine areas they are deficient in.  Behavioral Response: Attentive  Intervention: 6 Dimensions of Health  Activity: Patients were given a definition sheet of the 6 dimensions of health and a worksheet with each dimension on it. Patients were instructed to write 2-3 things they were currently doing in each dimension.  Education: LRT educated patients on ways they can improve each dimension.  Education Outcome: In group clarification offered   Clinical Observations/Feedback: Patient drew symbols representing in and out for each dimension. Patient did not contribute to group discussion.  Jacquelynn CreeGreene,Kreg Earhart M, LRT/CTRS 09/24/2015 2:10 PM

## 2015-09-24 NOTE — [facility] Group Notes (Signed)
[facility] LCSW Group Therapy   09/24/2015 9:30am Type of Therapy: Group Therapy   Participation Level: Invited but did not attend.  Participation Quality: Invited but did not attend.  Summary of Progress/Problems: Pt identified obstacles faced currently and processed barriers involved in overcoming these obstacles. Pt identified steps necessary for overcoming these obstacles and explored motivation (internal and external) for facing these difficulties head on. Pt further identified one area of concern in their lives and chose a goal to focus on for today.   Jeffery Haynie R. Jeffery Leonard, MSW, LCSWA     

## 2015-09-24 NOTE — Progress Notes (Signed)
D: Patient appears brighter but still very anxious. He denies SI/HI/AVH. States he's anxious because he's mom is out of the hospital and he needs to get home and take care of her. Still endorsing pain in his RLA.  Wound clean, dry, and intact. He has been visible in the milieu, interacting with staff and participating in group.  A: Medication given with education. Encouragement provided. PRN Ultram given for pain.  R: Patient has been compliant with medication. He has remained calm and cooperative. Safety maintained with 15 min checks.

## 2015-09-24 NOTE — [facility] Group Notes (Signed)
[facility] Group Notes:  (Nursing/MHT/Case Management/Adjunct)  Date:  09/24/2015  Time:  11:12 PM  Type of Therapy:  Evening Wrap-up Group  Participation Level:  Active  Participation Quality:  Appropriate and Attentive  Affect:  Appropriate  Cognitive:  Alert and Appropriate  Insight:  Appropriate, Good and Improving  Engagement in Group:  Developing/Improving and Engaged  Modes of Intervention:  Activity and Discussion  Summary of Progress/Problems:  Tomasita MorrowChelsea Nanta Quetzally Callas 09/24/2015, 11:12 PM

## 2015-09-24 NOTE — Progress Notes (Signed)
D: Pt denies SI/HI/AVH. Pt is irritable, angry and hostile towards staff. Patient appears anxious and he is not interacting with peers and staff appropriately.  A: Pt was offered support and encouragement. Pt was offered scheduled medications. Pt was encouraged to attend groups. Q 15 minute checks were done for safety.  R:Pt did not attend evening group. Pt is not completely complaint with medication : she refused some medication. Pt is not receptive to treatment. Safety maintained on unit.

## 2015-09-24 NOTE — Plan of Care (Signed)
Problem: Safety: Goal: Periods of time without injury will increase Outcome: Progressing Patient has been without injury since admission.

## 2015-09-25 MED ORDER — ESCITALOPRAM OXALATE 10 MG PO TABS: 10.0000 mg | ORAL_TABLET | Freq: Every day | ORAL | 0 refills | Status: AC

## 2015-09-25 MED ORDER — TRAZODONE HCL 100 MG PO TABS: 100.0000 mg | ORAL_TABLET | Freq: Every evening | ORAL | 0 refills | Status: AC | PRN

## 2015-09-25 NOTE — Progress Notes (Signed)
Recreation Therapy Notes  INPATIENT RECREATION TR PLAN  [name] MRN: [id number] DOB: 07-29-1965 Today's Date: 09/25/2015  Rec Therapy Plan Is patient appropriate for Therapeutic Recreation?: Yes Treatment times per week: At least once a week TR Treatment/Interventions: 1:1 session, Group participation (Comment) (Appropriate participation in daily recreational therapy tx)  Discharge Criteria Pt will be discharged from therapy if:: Treatment goals are met, Discharged Treatment plan/goals/alternatives discussed and agreed upon by:: Patient/family  Discharge Summary Short term goals set: See Care Plan Short term goals met: Complete Progress toward goals comments: One-to-one attended Which groups?: Wellness, Coping skills, Other (Comment) (Self-expression) One-to-one attended: Self-esteem, time management Reason goals not met: N/A Therapeutic equipment acquired: None Reason patient discharged from therapy: Discharge from hospital Pt/family agrees with progress & goals achieved: Yes Date patient discharged from therapy: 09/25/15   Leonette Monarch, LRT/CTRS 09/25/2015, 2:26 PM

## 2015-09-25 NOTE — Progress Notes (Signed)
  [facility] :  Will you be returning to the same living situation after discharge:  Yes,  home with mother At discharge, do you have transportation home?: Yes,  sister will pick pt up. Do you have the ability to pay for your medications: No. - Referred to [facility].  Release of information consent forms completed and in the chart;  Patient's signature needed at discharge.  Patient to Follow up at: Follow-up Information    [facility]. Go on 09/27/2015.   Why:  Please arrive to the walk-in clinic between the hours of 8am-2:30pm for an assessment for medication management and therapy.  Arrive as early as possible for prompt service.  Please call Unk PintoHarvey Bryant at [phone] for questions and assistance. Contact information: [facility] [place] [address] [place] [address] Ph: [phone] Fax: [phone]          Next level of care provider has access [facility] Link:no  Safety Planning and Suicide Prevention discussed: Yes,  SPE reviewed with pt and sister  Have you used any form of tobacco in the last 30 days? (Cigarettes, Smokeless Tobacco, Cigars, and/or Pipes): Yes  Has patient been referred to the Quitline?: Patient refused referral  Patient has been referred for addiction treatment: Pt. refused referral  Lynden OxfordKadijah R Gabi Mcfate, MSW, LCSW-A 09/25/2015, 12:02 PM
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
## 2015-09-25 NOTE — Plan of Care (Signed)
Problem: [facility] Participation in Recreation Therapeutic Interventions Goal: STG-Patient will demonstrate improved self esteem by identif STG: Self-Esteem - Within 4 treatment sessions, patient will verbalize at least 5 positive affirmation statements in of 2 treatment sessions to increase self-esteem post d/c.  Outcome: Completed/Met Date Met: 09/25/15 Treatment Session 2; Completed 2 out of 2:  At approximately 12:25 pm, LRT met with patient in patient room. Patient verbalized 5 positive affirmation statements. Patient reported it felt "pretty good". LRT encouraged patient to continue saying positive affirmation statements.   Leonette Monarch, LRT/CTRS 08.22.17 2:22 pm  Goal: STG-Other Recreation Therapy Goal (Specify) STG: Time Management - Within 4 treatment sessions, patient will verbalize understanding of scheduling in each of 2 treatment sessions to increase time management skills post d/c.  Outcome: Completed/Met Date Met: 09/25/15 Treatment Session 2; Completed 2 out of 2: At approximately 12:25 pm, LRT met with patient in patient room. Patient verbalized understanding of the scheduling. LRT encouraged patient to use the schedules.  Leonette Monarch, LRT/CTRS 08.22.17 2:23 pm

## 2015-09-25 NOTE — Progress Notes (Signed)
Discharge paperwork and prescriptions provided, enough medications for 7 days provided and reviewed. Verified understanding by use of teach back method. Pt to be provided transportation home by sister. Will continue to monitor.

## 2015-09-25 NOTE — Tx Team (Signed)
Interdisciplinary Treatment Plan Update (Adult)         Date: 09/25/2015   Time Reviewed: 10:30 AM   Progress in Treatment: Improving Attending groups: Yes  Participating in groups: Yes  Taking medication as prescribed: Yes  Tolerating medication: Yes  Family/Significant other contact made: CSW spoke with sister Patient understands diagnosis: Yes  Discussing patient identified problems/goals with staff: Yes  Medical problems stabilized or resolved: Yes  Denies suicidal/homicidal ideation: Yes  Issues/concerns per patient self-inventory: Yes  Other:   New problem(s) identified: N/A   Discharge Plan or Barriers: see below   Reason for Continuation of Hospitalization:   Depression   Anxiety   Medication Stabilization   Comments: N/A   Estimated length of stay: 3-5 days    Patient is a 50 year old male admitted for suicidal attempt and depression . Patient lives in [place], [place]. Patient will benefit from crisis stabilization, medication evaluation, group therapy, and psycho education in addition to case management for discharge planning. Patient and CSW reviewed pt's identified goals and treatment plan. Pt verbalized understanding and agreed to treatment plan.    Review of initial/current patient goals per problem list:  1. Goal(s): Patient will participate in aftercare plan   Met: Yes  Target date: 3-5 days post admission date   As evidenced by: Patient will participate within aftercare plan AEB aftercare provider and housing plan at discharge being identified.   Patient will follow-up with [place].   2. Goal (s): Patient will exhibit decreased depressive symptoms and suicidal ideations.   Met: Yes  Target date: 3-5 days post admission date   As evidenced by: Patient will utilize self-rating of depression at 3 or below and demonstrate decreased signs of depression or be deemed stable for discharge by MD.   Pt reports a depression score of 2 at this  time. Adequate for discharge per MD.   3. Goal(s): Patient will demonstrate decreased signs and symptoms of anxiety.   Met: Yes  Target date: 3-5 days post admission date   As evidenced by: Patient will utilize self-rating of anxiety at 3 or below and demonstrated decreased signs of anxiety, or be deemed stable for discharge by MD   Pt reports an anxiety score of 3. Adequate for discharge per MD.      Attendees:  Patient: Jeffery Leonard Family:  Physician: Merlyn Albert, MD    09/25/2015 10:30AM  Nursing: Polly Cobia, RN     09/25/2015 10:30AM  Clinical Social Worker: Glorious Peach, [place]  09/25/2015 10:30AM  Other: Recreational Therapist, Everitt Amber  09/25/2015 10:30AM
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
## 2015-09-25 NOTE — Plan of Care (Signed)
Problem: Health Behavior/Discharge Planning: Goal: Compliance with therapeutic regimen will improve Outcome: Progressing Pt completes self inventory during admission. Verbalizes understanding to complete daily once discharged. Compliant with medications and plans to be when discharged. Will continue to monitor.
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
